# Patient Record
Sex: Male | Born: 1955
Health system: Southern US, Community
[De-identification: ages and names within clinical notes are randomized; demographics above are authoritative.]

## PROBLEM LIST (undated history)

## (undated) DIAGNOSIS — Z85828 Personal history of other malignant neoplasm of skin: Secondary | ICD-10-CM

## (undated) DIAGNOSIS — J309 Allergic rhinitis, unspecified: Secondary | ICD-10-CM

## (undated) DIAGNOSIS — E785 Hyperlipidemia, unspecified: Secondary | ICD-10-CM

## (undated) DIAGNOSIS — I639 Cerebral infarction, unspecified: Secondary | ICD-10-CM

## (undated) DIAGNOSIS — Z87442 Personal history of urinary calculi: Secondary | ICD-10-CM

## (undated) DIAGNOSIS — I6523 Occlusion and stenosis of bilateral carotid arteries: Secondary | ICD-10-CM

## (undated) DIAGNOSIS — I10 Essential (primary) hypertension: Secondary | ICD-10-CM

## (undated) HISTORY — PX: COLONOSCOPY: SHX174

## (undated) HISTORY — PX: OTHER SURGICAL HISTORY: SHX169

---

## 2006-04-19 ENCOUNTER — Ambulatory Visit: Payer: Self-pay | Admitting: Unknown Physician Specialty

## 2006-04-24 ENCOUNTER — Ambulatory Visit: Payer: Self-pay | Admitting: Family Medicine

## 2007-02-19 ENCOUNTER — Ambulatory Visit: Payer: Self-pay | Admitting: Family Medicine

## 2007-04-05 ENCOUNTER — Ambulatory Visit: Payer: Self-pay | Admitting: Unknown Physician Specialty

## 2017-01-04 DIAGNOSIS — L570 Actinic keratosis: Secondary | ICD-10-CM | POA: Diagnosis not present

## 2017-01-04 DIAGNOSIS — Z85828 Personal history of other malignant neoplasm of skin: Secondary | ICD-10-CM | POA: Diagnosis not present

## 2017-08-27 DIAGNOSIS — Z23 Encounter for immunization: Secondary | ICD-10-CM | POA: Diagnosis not present

## 2017-09-06 DIAGNOSIS — L570 Actinic keratosis: Secondary | ICD-10-CM | POA: Diagnosis not present

## 2017-09-06 DIAGNOSIS — Z85828 Personal history of other malignant neoplasm of skin: Secondary | ICD-10-CM | POA: Diagnosis not present

## 2017-10-25 DIAGNOSIS — G459 Transient cerebral ischemic attack, unspecified: Secondary | ICD-10-CM | POA: Diagnosis not present

## 2017-10-25 DIAGNOSIS — Z0000 Encounter for general adult medical examination without abnormal findings: Secondary | ICD-10-CM | POA: Diagnosis not present

## 2017-10-25 DIAGNOSIS — R972 Elevated prostate specific antigen [PSA]: Secondary | ICD-10-CM | POA: Diagnosis not present

## 2017-11-15 ENCOUNTER — Other Ambulatory Visit: Payer: Self-pay | Admitting: Family Medicine

## 2017-11-15 DIAGNOSIS — G459 Transient cerebral ischemic attack, unspecified: Secondary | ICD-10-CM

## 2017-11-24 ENCOUNTER — Ambulatory Visit
Admission: RE | Admit: 2017-11-24 | Discharge: 2017-11-24 | Disposition: A | Discharge status: Home / Self Care | Payer: 59 | Source: Ambulatory Visit | Attending: Family Medicine | Admitting: Family Medicine

## 2017-11-24 DIAGNOSIS — G459 Transient cerebral ischemic attack, unspecified: Secondary | ICD-10-CM | POA: Diagnosis not present

## 2017-11-29 ENCOUNTER — Other Ambulatory Visit: Payer: Self-pay | Admitting: Family Medicine

## 2017-11-29 DIAGNOSIS — I6381 Other cerebral infarction due to occlusion or stenosis of small artery: Secondary | ICD-10-CM

## 2017-11-29 DIAGNOSIS — R9389 Abnormal findings on diagnostic imaging of other specified body structures: Secondary | ICD-10-CM

## 2017-12-06 ENCOUNTER — Ambulatory Visit
Admission: RE | Admit: 2017-12-06 | Discharge: 2017-12-06 | Disposition: A | Discharge status: Home / Self Care | Payer: 59 | Source: Ambulatory Visit | Attending: Family Medicine | Admitting: Family Medicine

## 2017-12-06 DIAGNOSIS — R9389 Abnormal findings on diagnostic imaging of other specified body structures: Secondary | ICD-10-CM

## 2017-12-06 DIAGNOSIS — I639 Cerebral infarction, unspecified: Secondary | ICD-10-CM | POA: Diagnosis not present

## 2017-12-06 DIAGNOSIS — I6381 Other cerebral infarction due to occlusion or stenosis of small artery: Secondary | ICD-10-CM

## 2017-12-06 MED ORDER — GADOBENATE DIMEGLUMINE 529 MG/ML IV SOLN
20.0000 mL | Freq: Once | INTRAVENOUS | Status: AC | PRN
  Administered 2017-12-06: 20 mL via INTRAVENOUS

## 2017-12-21 DIAGNOSIS — R9089 Other abnormal findings on diagnostic imaging of central nervous system: Secondary | ICD-10-CM | POA: Diagnosis not present

## 2017-12-21 DIAGNOSIS — Z86010 Personal history of colonic polyps: Secondary | ICD-10-CM | POA: Diagnosis not present

## 2017-12-21 DIAGNOSIS — Z8673 Personal history of transient ischemic attack (TIA), and cerebral infarction without residual deficits: Secondary | ICD-10-CM | POA: Diagnosis not present

## 2018-01-25 DIAGNOSIS — Z8673 Personal history of transient ischemic attack (TIA), and cerebral infarction without residual deficits: Secondary | ICD-10-CM | POA: Diagnosis not present

## 2018-01-25 DIAGNOSIS — I6381 Other cerebral infarction due to occlusion or stenosis of small artery: Secondary | ICD-10-CM | POA: Diagnosis not present

## 2018-02-02 DIAGNOSIS — I639 Cerebral infarction, unspecified: Secondary | ICD-10-CM | POA: Diagnosis not present

## 2018-02-06 DIAGNOSIS — Z8673 Personal history of transient ischemic attack (TIA), and cerebral infarction without residual deficits: Secondary | ICD-10-CM | POA: Diagnosis not present

## 2018-02-06 DIAGNOSIS — I6523 Occlusion and stenosis of bilateral carotid arteries: Secondary | ICD-10-CM | POA: Diagnosis not present

## 2018-02-14 DIAGNOSIS — I639 Cerebral infarction, unspecified: Secondary | ICD-10-CM | POA: Diagnosis not present

## 2018-02-22 DIAGNOSIS — E782 Mixed hyperlipidemia: Secondary | ICD-10-CM | POA: Diagnosis not present

## 2018-02-22 DIAGNOSIS — D696 Thrombocytopenia, unspecified: Secondary | ICD-10-CM | POA: Diagnosis not present

## 2018-02-26 DIAGNOSIS — I10 Essential (primary) hypertension: Secondary | ICD-10-CM

## 2018-02-26 DIAGNOSIS — I6523 Occlusion and stenosis of bilateral carotid arteries: Secondary | ICD-10-CM | POA: Diagnosis not present

## 2018-02-26 DIAGNOSIS — E782 Mixed hyperlipidemia: Secondary | ICD-10-CM | POA: Diagnosis not present

## 2018-02-26 HISTORY — DX: Occlusion and stenosis of bilateral carotid arteries: I65.23

## 2018-02-26 HISTORY — DX: Essential (primary) hypertension: I10

## 2018-02-27 DIAGNOSIS — D696 Thrombocytopenia, unspecified: Secondary | ICD-10-CM | POA: Diagnosis not present

## 2018-02-27 DIAGNOSIS — E782 Mixed hyperlipidemia: Secondary | ICD-10-CM | POA: Diagnosis not present

## 2018-02-27 DIAGNOSIS — I10 Essential (primary) hypertension: Secondary | ICD-10-CM | POA: Diagnosis not present

## 2018-03-06 ENCOUNTER — Ambulatory Visit: Admit: 2018-03-06 | Payer: 59 | Admitting: Internal Medicine

## 2018-03-06 SURGERY — ECHOCARDIOGRAM, TRANSESOPHAGEAL
Anesthesia: Moderate Sedation

## 2018-04-30 DIAGNOSIS — Z8673 Personal history of transient ischemic attack (TIA), and cerebral infarction without residual deficits: Secondary | ICD-10-CM | POA: Diagnosis not present

## 2018-04-30 DIAGNOSIS — I6381 Other cerebral infarction due to occlusion or stenosis of small artery: Secondary | ICD-10-CM | POA: Diagnosis not present

## 2018-06-22 DIAGNOSIS — D696 Thrombocytopenia, unspecified: Secondary | ICD-10-CM | POA: Diagnosis not present

## 2018-06-22 DIAGNOSIS — I10 Essential (primary) hypertension: Secondary | ICD-10-CM | POA: Diagnosis not present

## 2018-06-22 DIAGNOSIS — E782 Mixed hyperlipidemia: Secondary | ICD-10-CM | POA: Diagnosis not present

## 2018-06-22 DIAGNOSIS — I6381 Other cerebral infarction due to occlusion or stenosis of small artery: Secondary | ICD-10-CM | POA: Diagnosis not present

## 2018-07-02 DIAGNOSIS — E782 Mixed hyperlipidemia: Secondary | ICD-10-CM | POA: Diagnosis not present

## 2018-07-02 DIAGNOSIS — I10 Essential (primary) hypertension: Secondary | ICD-10-CM | POA: Diagnosis not present

## 2018-07-02 DIAGNOSIS — D696 Thrombocytopenia, unspecified: Secondary | ICD-10-CM | POA: Diagnosis not present

## 2018-07-10 DIAGNOSIS — D2262 Melanocytic nevi of left upper limb, including shoulder: Secondary | ICD-10-CM | POA: Diagnosis not present

## 2018-07-10 DIAGNOSIS — X32XXXA Exposure to sunlight, initial encounter: Secondary | ICD-10-CM | POA: Diagnosis not present

## 2018-07-10 DIAGNOSIS — D2261 Melanocytic nevi of right upper limb, including shoulder: Secondary | ICD-10-CM | POA: Diagnosis not present

## 2018-07-10 DIAGNOSIS — Z85828 Personal history of other malignant neoplasm of skin: Secondary | ICD-10-CM | POA: Diagnosis not present

## 2018-07-10 DIAGNOSIS — L570 Actinic keratosis: Secondary | ICD-10-CM | POA: Diagnosis not present

## 2018-08-16 DIAGNOSIS — Z86010 Personal history of colonic polyps: Secondary | ICD-10-CM | POA: Diagnosis not present

## 2018-10-17 DIAGNOSIS — I358 Other nonrheumatic aortic valve disorders: Secondary | ICD-10-CM | POA: Diagnosis not present

## 2018-10-17 DIAGNOSIS — I10 Essential (primary) hypertension: Secondary | ICD-10-CM | POA: Diagnosis not present

## 2018-10-17 DIAGNOSIS — E782 Mixed hyperlipidemia: Secondary | ICD-10-CM | POA: Diagnosis not present

## 2018-10-22 DIAGNOSIS — I10 Essential (primary) hypertension: Secondary | ICD-10-CM | POA: Diagnosis not present

## 2018-10-22 DIAGNOSIS — D696 Thrombocytopenia, unspecified: Secondary | ICD-10-CM | POA: Diagnosis not present

## 2018-10-22 DIAGNOSIS — E782 Mixed hyperlipidemia: Secondary | ICD-10-CM | POA: Diagnosis not present

## 2018-10-23 ENCOUNTER — Encounter: Payer: Self-pay | Admitting: *Deleted

## 2018-10-24 ENCOUNTER — Ambulatory Visit
Admission: RE | Admit: 2018-10-24 | Discharge: 2018-10-24 | Disposition: A | Discharge status: Home / Self Care | Payer: 59 | Source: Ambulatory Visit | Attending: Unknown Physician Specialty | Admitting: Unknown Physician Specialty

## 2018-10-24 ENCOUNTER — Encounter
Admission: RE | Disposition: A | Discharge status: Home / Self Care | Payer: Self-pay | Source: Ambulatory Visit | Attending: Unknown Physician Specialty

## 2018-10-24 ENCOUNTER — Ambulatory Visit: Payer: 59 | Admitting: Anesthesiology

## 2018-10-24 ENCOUNTER — Other Ambulatory Visit: Payer: Self-pay

## 2018-10-24 DIAGNOSIS — E785 Hyperlipidemia, unspecified: Secondary | ICD-10-CM | POA: Diagnosis not present

## 2018-10-24 DIAGNOSIS — K640 First degree hemorrhoids: Secondary | ICD-10-CM | POA: Diagnosis not present

## 2018-10-24 DIAGNOSIS — Z7982 Long term (current) use of aspirin: Secondary | ICD-10-CM | POA: Insufficient documentation

## 2018-10-24 DIAGNOSIS — J309 Allergic rhinitis, unspecified: Secondary | ICD-10-CM | POA: Diagnosis not present

## 2018-10-24 DIAGNOSIS — K635 Polyp of colon: Secondary | ICD-10-CM | POA: Insufficient documentation

## 2018-10-24 DIAGNOSIS — I10 Essential (primary) hypertension: Secondary | ICD-10-CM | POA: Diagnosis not present

## 2018-10-24 DIAGNOSIS — Z79899 Other long term (current) drug therapy: Secondary | ICD-10-CM | POA: Diagnosis not present

## 2018-10-24 DIAGNOSIS — Z1211 Encounter for screening for malignant neoplasm of colon: Secondary | ICD-10-CM | POA: Diagnosis not present

## 2018-10-24 DIAGNOSIS — Z86010 Personal history of colonic polyps: Secondary | ICD-10-CM | POA: Diagnosis not present

## 2018-10-24 DIAGNOSIS — Z85828 Personal history of other malignant neoplasm of skin: Secondary | ICD-10-CM | POA: Diagnosis not present

## 2018-10-24 HISTORY — DX: Occlusion and stenosis of bilateral carotid arteries: I65.23

## 2018-10-24 HISTORY — DX: Personal history of other malignant neoplasm of skin: Z85.828

## 2018-10-24 HISTORY — DX: Hyperlipidemia, unspecified: E78.5

## 2018-10-24 HISTORY — DX: Personal history of urinary calculi: Z87.442

## 2018-10-24 HISTORY — DX: Essential (primary) hypertension: I10

## 2018-10-24 HISTORY — DX: Cerebral infarction, unspecified: I63.9

## 2018-10-24 HISTORY — DX: Allergic rhinitis, unspecified: J30.9

## 2018-10-24 HISTORY — PX: COLONOSCOPY WITH PROPOFOL: SHX5780

## 2018-10-24 SURGERY — COLONOSCOPY WITH PROPOFOL
Anesthesia: General

## 2018-10-24 MED ORDER — PROPOFOL 10 MG/ML IV BOLUS
INTRAVENOUS | Status: DC | PRN
  Administered 2018-10-24: 50 mg via INTRAVENOUS

## 2018-10-24 MED ORDER — SODIUM CHLORIDE 0.9 % IV SOLN
INTRAVENOUS | Status: DC
  Administered 2018-10-24: 12:00:00 via INTRAVENOUS

## 2018-10-24 MED ORDER — PROPOFOL 500 MG/50ML IV EMUL
INTRAVENOUS | Status: DC | PRN
  Administered 2018-10-24: 120 ug/kg/min via INTRAVENOUS

## 2018-10-24 MED ORDER — SODIUM CHLORIDE 0.9 % IV SOLN: INTRAVENOUS | Status: DC

## 2018-10-24 NOTE — Anesthesia Postprocedure Evaluation (Signed)
Anesthesia Post Note  Patient: Johnathan Schneider  Procedure(s) Performed: COLONOSCOPY WITH PROPOFOL (N/A )  Patient location during evaluation: Endoscopy Anesthesia Type: General Level of consciousness: awake and alert Pain management: pain level controlled Vital Signs Assessment: post-procedure vital signs reviewed and stable Respiratory status: spontaneous breathing, nonlabored ventilation, respiratory function stable and patient connected to nasal cannula oxygen Cardiovascular status: blood pressure returned to baseline and stable Postop Assessment: no apparent nausea or vomiting Anesthetic complications: no     Last Vitals:  Vitals:   10/24/18 1113 10/24/18 1220  BP: 123/70 (!) 83/52  Pulse: 61 60  Resp: 18 (!) 26  Temp: 36.7 C (!) 36.2 C  SpO2: 98% 95%    Last Pain:  Vitals:   10/24/18 1220  TempSrc:   PainSc: 0-No pain                 Alicyn Klann S

## 2018-10-24 NOTE — H&P (Signed)
Primary Care Physician:  Dion Body, MD Primary Gastroenterologist:  Dr. Vira Agar  Pre-Procedure History & Physical: HPI:  Johnathan Schneider is a 62 y.o. male is here for an colonoscopy.  Last colonoscopy was 12 years ago.   Past Medical History:  Diagnosis Date  . Allergic rhinitis   . Atherosclerosis of both carotid arteries 02/26/2018  . History of basal cell carcinoma   . History of kidney stones   . Hyperlipidemia   . Hypertension 02/26/2018  . Stroke [facility])     Past Surgical History:  Procedure Laterality Date  . BCC excision    . COLONOSCOPY  05/33/2007    Prior to Admission medications   Medication Sig Start Date End Date Taking? Authorizing Provider  aspirin 81 MG tablet Take 81 mg by mouth daily.   Yes [provider]  atorvastatin (LIPITOR) 80 MG tablet Take 80 mg by mouth daily.   Yes [provider]  BIOTIN PO Take by mouth daily.   Yes [provider]  fexofenadine (ALLEGRA) 60 MG tablet Take 60 mg by mouth 2 (two) times daily.   Yes [provider]  lisinopril (PRINIVIL,ZESTRIL) 5 MG tablet Take 5 mg by mouth daily.   Yes [provider]  Multiple Vitamin (MULTIVITAMIN) tablet Take 1 tablet by mouth daily.   Yes [provider]  niacin 500 MG tablet Take 500 mg by mouth at bedtime.   Yes [provider]  Omega-3 Fatty Acids (FISH OIL PO) Take by mouth daily.   Yes [provider]    Allergies as of 09/06/2018  . (Not on File)    Family History  Problem Relation Age of Onset  . Hyperlipidemia Mother   . Adrenal disorder Mother   . Stroke Mother   . AAA (abdominal aortic aneurysm) Father   . Coronary artery disease Father     Social History   Socioeconomic History  . Marital status: Married    Spouse name: Not on file  . Number of children: Not on file  . Years of education: Not on file  . Highest education level: Not on file  Occupational History  . Not on file  Social  Needs  . Financial resource strain: Not on file  . Food insecurity:    Worry: Not on file    Inability: Not on file  . Transportation needs:    Medical: Not on file    Non-medical: Not on file  Tobacco Use  . Smoking status: Never Smoker  . Smokeless tobacco: Never Used  Substance and Sexual Activity  . Alcohol use: Yes    Alcohol/week: 3.0 standard drinks    Types: 3 Glasses of wine per week  . Drug use: Never  . Sexual activity: Yes  Lifestyle  . Physical activity:    Days per week: Not on file    Minutes per session: Not on file  . Stress: Not on file  Relationships  . Social connections:    Talks on phone: Not on file    Gets together: Not on file    Attends religious service: Not on file    Active member of club or organization: Not on file    Attends meetings of clubs or organizations: Not on file    Relationship status: Not on file  . Intimate partner violence:    Fear of current or ex partner: Not on file    Emotionally abused: Not on file    Physically abused: Not  on file    Forced sexual activity: Not on file  Other Topics Concern  . Not on file  Social History Narrative  . Not on file    Review of Systems: See HPI, otherwise negative ROS  Physical Exam: BP 123/70   Pulse 61   Temp 98 F (36.7 C) (Oral)   Resp 18   Ht 5\' 9"  (1.753 m)   Wt 81.2 kg   SpO2 98%   BMI 26.43 kg/m  General:   Alert,  pleasant and cooperative in NAD Head:  Normocephalic and atraumatic. Neck:  Supple; no masses or thyromegaly. Lungs:  Clear throughout to auscultation.    Heart:  Regular rate and rhythm. Abdomen:  Soft, nontender and nondistended. Normal bowel sounds, without guarding, and without rebound.   Neurologic:  Alert and  oriented x4;  grossly normal neurologically.  Impression/Plan: Johnathan Schneider is here for an colonoscopy to be performed for [facility] colon polyps.  Risks, benefits, limitations, and alternatives regarding  colonoscopy have been reviewed with the  patient.  Questions have been answered.  All parties agreeable.   Gaylyn Cheers, MD  10/24/2018, 11:44 AM

## 2018-10-24 NOTE — Anesthesia Preprocedure Evaluation (Signed)
Anesthesia Evaluation  Patient identified by MRN, date of birth, ID band Patient awake    Reviewed: Allergy & Precautions, NPO status , Patient's Chart, lab work & pertinent test results, reviewed documented beta blocker date and time   Airway Mallampati: II  TM Distance: >3 FB     Dental  (+) Chipped   Pulmonary           Cardiovascular hypertension, Pt. on medications      Neuro/Psych    GI/Hepatic   Endo/Other    Renal/GU      Musculoskeletal   Abdominal   Peds  Hematology   Anesthesia Other Findings Denies stroke. Posible TIA.  Reproductive/Obstetrics                             Anesthesia Physical Anesthesia Plan  ASA: II  Anesthesia Plan: General   Post-op Pain Management:    Induction: Intravenous  PONV Risk Score and Plan:   Airway Management Planned:   Additional Equipment:   Intra-op Plan:   Post-operative Plan:   Informed Consent: I have reviewed the patients History and Physical, chart, labs and discussed the procedure including the risks, benefits and alternatives for the proposed anesthesia with the patient or authorized representative who has indicated his/her understanding and acceptance.     Plan Discussed with: CRNA  Anesthesia Plan Comments:         Anesthesia Quick Evaluation

## 2018-10-24 NOTE — Transfer of Care (Signed)
Immediate Anesthesia Transfer of Care Note  Patient: Johnathan Schneider  Procedure(s) Performed: COLONOSCOPY WITH PROPOFOL (N/A )  Patient Location: PACU and Endoscopy Unit  Anesthesia Type:General  Level of Consciousness: drowsy  Airway & Oxygen Therapy: Patient Spontanous Breathing  Post-op Assessment: Report given to RN  Post vital signs: Reviewed and stable  Last Vitals:  Vitals Value Taken Time  BP 90/52 10/24/2018 12:20 PM  Temp 36.2 C 10/24/2018 12:20 PM  Pulse 59 10/24/2018 12:21 PM  Resp 15 10/24/2018 12:21 PM  SpO2 96 % 10/24/2018 12:21 PM  Vitals shown include unvalidated device data.  Last Pain:  Vitals:   10/24/18 1220  TempSrc:   PainSc: (P) 0-No pain         Complications: No apparent anesthesia complications

## 2018-10-24 NOTE — Anesthesia Post-op Follow-up Note (Signed)
Anesthesia QCDR form completed.        

## 2018-10-25 ENCOUNTER — Encounter: Payer: Self-pay | Admitting: Unknown Physician Specialty

## 2018-10-25 LAB — SURGICAL PATHOLOGY

## 2018-10-25 NOTE — Op Note (Signed)
[facility] Gastroenterology Patient Name: Johnathan Schneider Procedure Date: 10/24/2018 11:38 AM MRN: [id number] Account #: [id number] Date of Birth: 1956/05/20 Admit Type: Outpatient Age: 62 Room: [facility] ENDO ROOM 2 Gender: Male Note Status: Finalized Procedure:            Colonoscopy Indications:          Screening for colorectal malignant neoplasm Providers:            Manya Silvas, MD Referring MD:         Dion Body (Referring MD) Medicines:            Propofol per Anesthesia Complications:        No immediate complications. Procedure:            Pre-Anesthesia Assessment:                       - After reviewing the risks and benefits, the patient                        was deemed in satisfactory condition to undergo the                        procedure.                       After obtaining informed consent, the colonoscope was                        passed under direct vision. Throughout the procedure,                        the patient's blood pressure, pulse, and oxygen                        saturations were monitored continuously. The                        Colonoscope was introduced through the anus and                        advanced to the the cecum, identified by appendiceal                        orifice and ileocecal valve. The colonoscopy was                        performed without difficulty. The patient tolerated the                        procedure well. The quality of the bowel preparation                        was excellent. Findings:      A diminutive polyp was found in the ascending colon. The polyp was       sessile. The polyp was removed with a cold snare. Resection and       retrieval were complete.      Internal hemorrhoids were found during endoscopy. The hemorrhoids were       small and Grade I (internal hemorrhoids that do not prolapse).      The exam was otherwise without  abnormality. Impression:           - One diminutive  polyp in the ascending colon, removed                        with a cold snare. Resected and retrieved.                       - Internal hemorrhoids.                       - The examination was otherwise normal. Recommendation:       - Await pathology results. Manya Silvas, MD 10/24/2018 12:25:28 PM This report has been signed electronically. Number of Addenda: 0 Note Initiated On: 10/24/2018 11:38 AM Scope Withdrawal Time: 0 hours 8 minutes 9 seconds  Total Procedure Duration: 0 hours 23 minutes 50 seconds       [facility]

## 2018-10-29 DIAGNOSIS — Z0000 Encounter for general adult medical examination without abnormal findings: Secondary | ICD-10-CM | POA: Diagnosis not present

## 2018-10-29 DIAGNOSIS — E782 Mixed hyperlipidemia: Secondary | ICD-10-CM | POA: Diagnosis not present

## 2018-10-29 DIAGNOSIS — I10 Essential (primary) hypertension: Secondary | ICD-10-CM | POA: Diagnosis not present

## 2019-01-23 IMAGING — MR MR HEAD WO/W CM
13 series · 48 of 48 positions shown · IV contrast (multihance)
Comparison: CT head 11/24/2017

CLINICAL DATA: Lacunar infarction.  Abnormal CT.

EXAM:
MRI HEAD WITHOUT AND WITH CONTRAST
TECHNIQUE: Multiplanar, multiecho pulse sequences of the brain and surrounding
structures were obtained without and with intravenous contrast.
CONTRAST:  20mL MULTIHANCE GADOBENATE DIMEGLUMINE 529 MG/ML IV SOLN

[Series 2: T1 · sagittal · 5.0mm · 0.47mm/px · 3 of 28 slices shown]
[im 1/28]
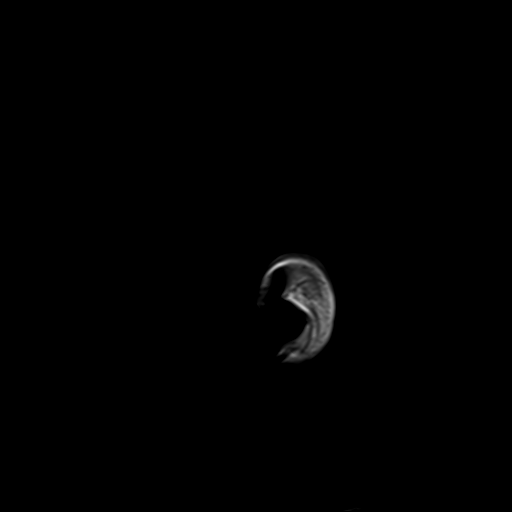
[im 14/28]
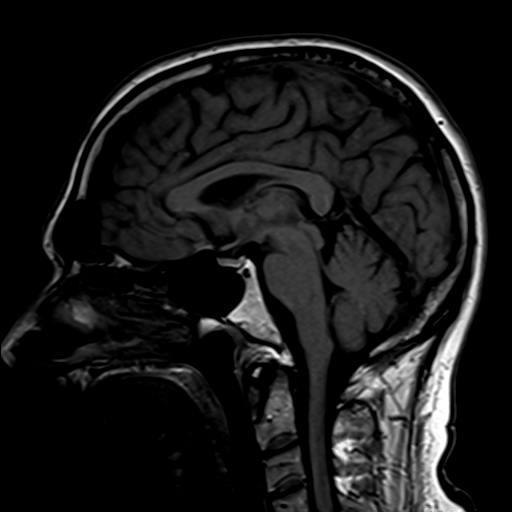
[im 28/28]
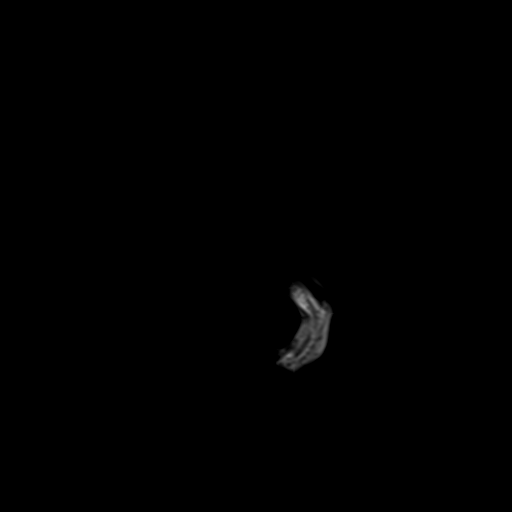

[Series 3: DWI · axial · 3.0mm · 1.88mm/px · z∈[-76,+82]mm · 7 of 108 slices shown (1 of 4)]
[im 1/108]
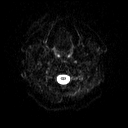
[im 18/108]
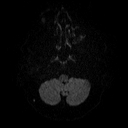
[im 36/108]
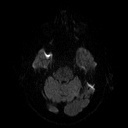
[im 54/108]
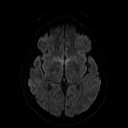
[im 72/108]
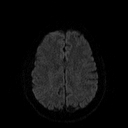
[im 90/108]
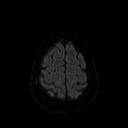
[im 108/108]
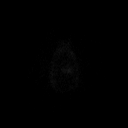

[Series 4: DWI · axial · 3.0mm · 1.88mm/px · z∈[-76,+82]mm · 3 of 53 slices shown (2 of 4)]
[im 1/53]
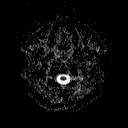
[im 27/53]
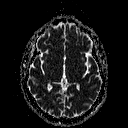
[im 53/53]
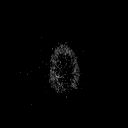

[Series 5: FLAIR · axial · 3.0mm · 0.47mm/px · z∈[-75,+81]mm · 2 of 35 slices shown]
[im 1/35]
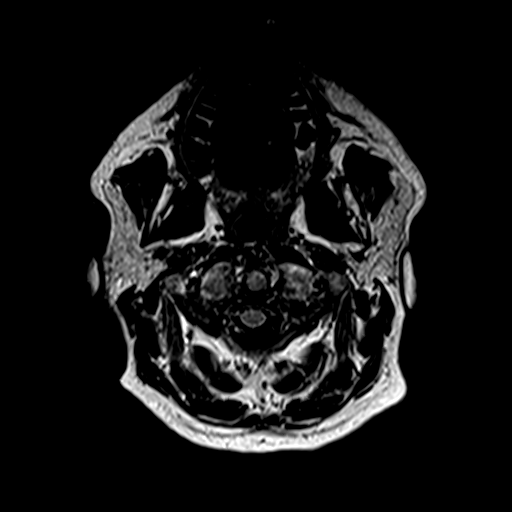
[im 35/35]
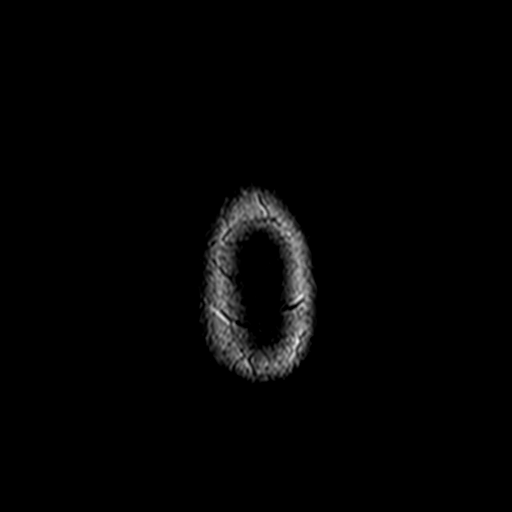

[Series 6: T2 · axial · 5.0mm · 0.62mm/px · 1 of 25 slices shown (1 of 2)]
[im 1/25]
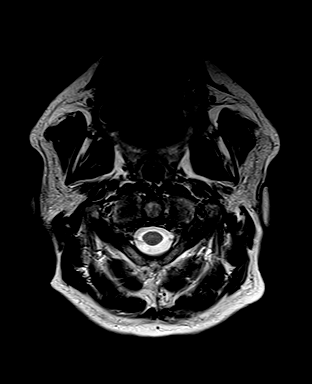

[Series 7: mip_images(sw) · axial · 40.0mm · 0.94mm/px · 1 of 25 slices shown]
[im 1/25]
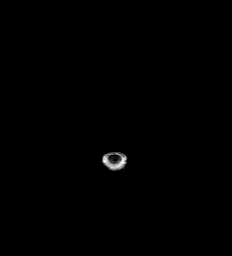

[Series 8: swi_images · axial · 5.0mm · 0.94mm/px · z∈[-74,+80]mm · 2 of 32 slices shown]
[im 1/32]
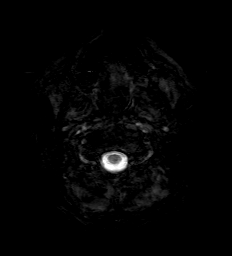
[im 32/32]
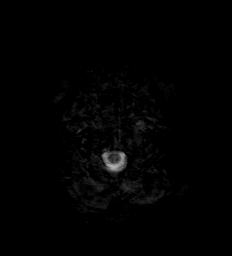

[Series 9: t1_mpr_tra · axial · 1.0mm · 0.75mm/px · z∈[-76,+81]mm · 9 of 160 slices shown (1 of 2)]
[im 1/160]
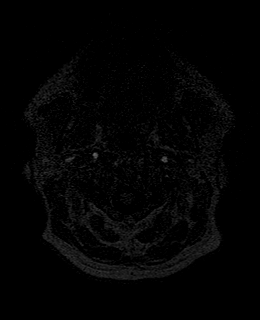
[im 20/160]
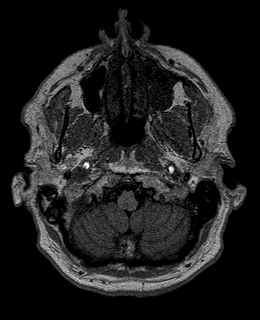
[im 40/160]
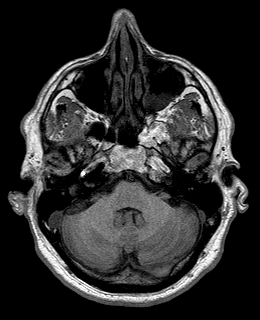
[im 60/160]
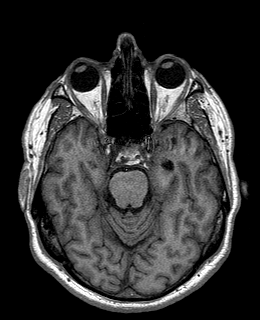
[im 80/160]
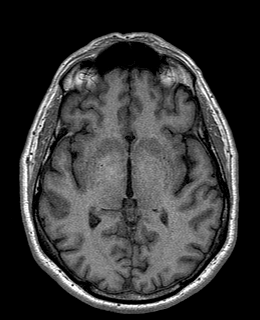
[im 100/160]
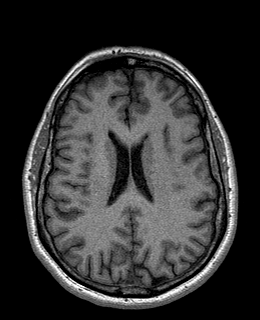
[im 120/160]
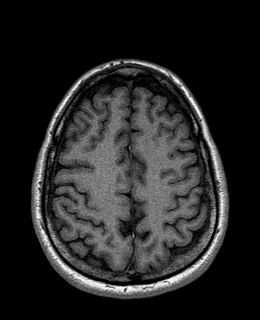
[im 140/160]
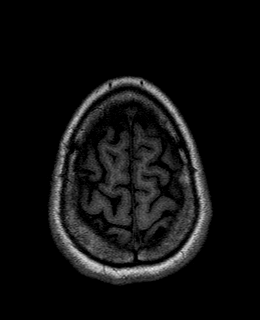
[im 160/160]
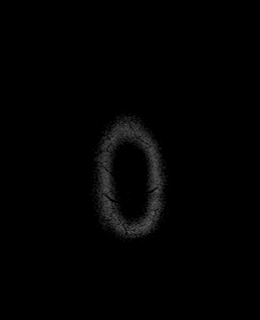

[Series 10: DWI · coronal · 5.0mm · 1.80mm/px · 5 of 82 slices shown (3 of 4)]
[im 1/82]
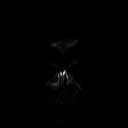
[im 21/82]
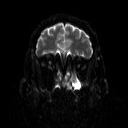
[im 41/82]
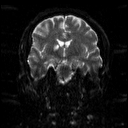
[im 61/82]
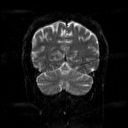
[im 82/82]
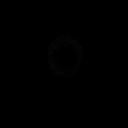

[Series 11: DWI · coronal · 5.0mm · 1.80mm/px · 2 of 41 slices shown (4 of 4)]
[im 1/41]
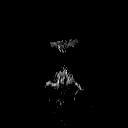
[im 41/41]
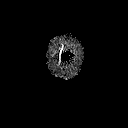

[Series 12: T2 · coronal · 5.0mm · 0.45mm/px · 2 of 32 slices shown (2 of 2)]
[im 1/32]
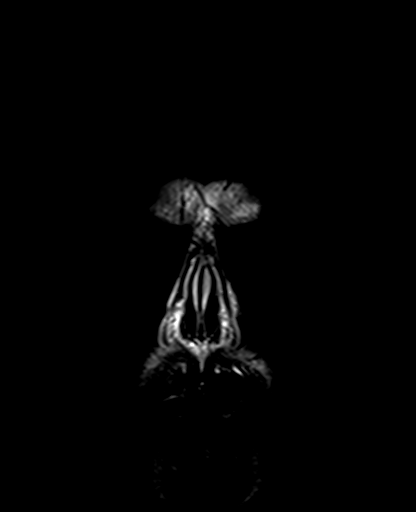
[im 32/32]
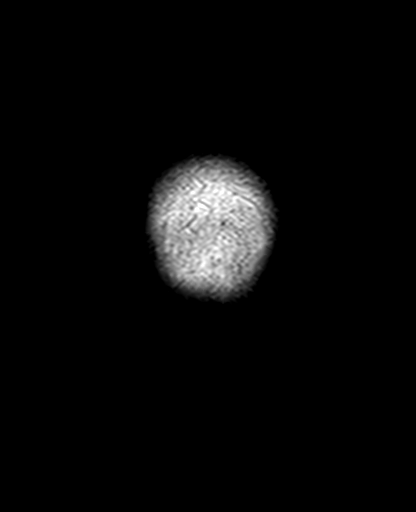

[Series 13: t1_mpr_tra · axial · 1.0mm · 0.75mm/px · z∈[-76,+81]mm · 9 of 160 slices shown (2 of 2)]
[im 1/160]
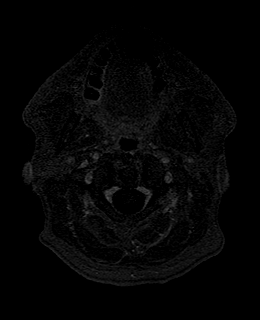
[im 20/160]
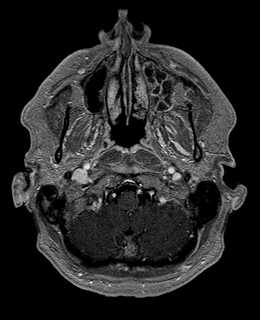
[im 40/160]
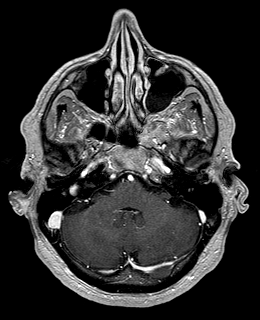
[im 60/160]
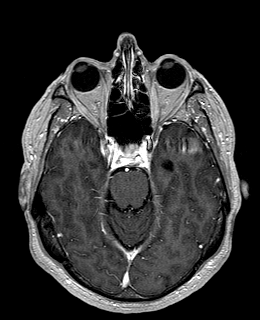
[im 80/160]
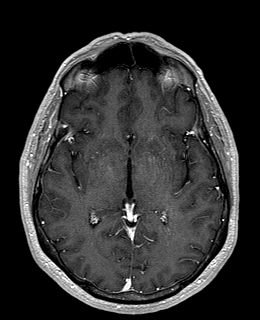
[im 100/160]
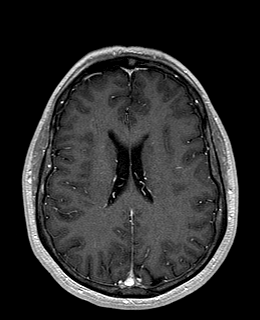
[im 120/160]
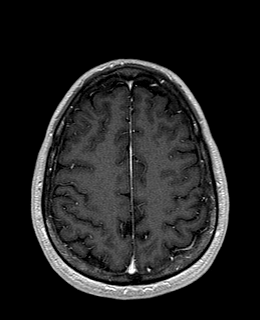
[im 140/160]
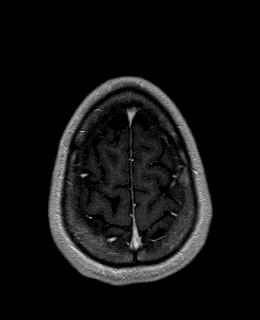
[im 160/160]
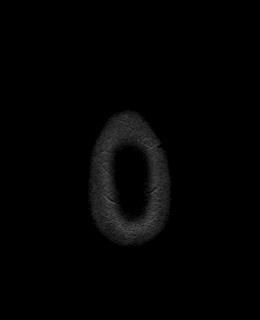

[Series 14: post cor · coronal · 5.0mm · 0.45mm/px · 2 of 32 slices shown]
[im 1/32]
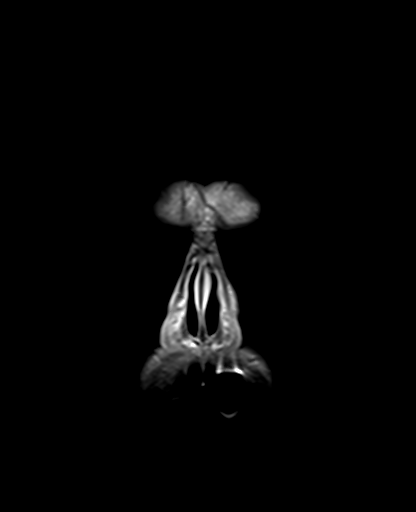
[im 32/32]
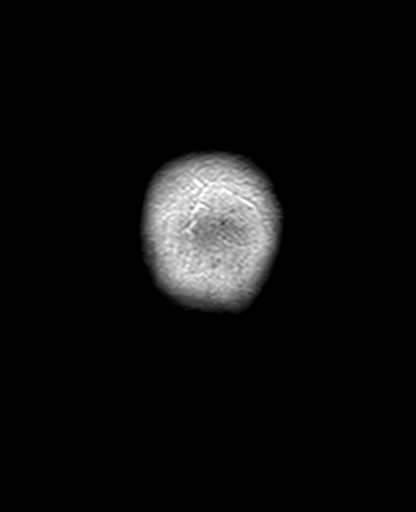

[48 of 48 positions shown; findings below may reference images not displayed]

FINDINGS: Brain: Lacunar infarction in the posterior limb internal capsule on
the right. The medial component has well-defined CSF fluid signal
characteristics compatible with chronic lacunar infarction. Just
lateral to this is an area of increased signal on diffusion-weighted
imaging and normalized ADC map. The lesion does not show abnormal
enhancement or surrounding edema.

No other significant infarction identified. Negative for hemorrhage
or mass. Normal enhancement postcontrast infusion.

Vascular: Normal arterial flow voids.  Normal venous enhancement.

Skull and upper cervical spine: Negative

Sinuses/Orbits: Mucosal edema and air-fluid level left maxillary
sinus. Normal orbit.

Other: None
IMPRESSION: Lacunar infarction posterior limb internal capsule on the right. The
majority of this appears chronic. There is tissue just lateral to
the lacunar infarction which could represent recent extension of
infarction and subacute infarct. Correlate with any change in
neurologic exam over the last several weeks. Otherwise negative.

## 2024-03-21 ENCOUNTER — Encounter: Payer: Self-pay | Admitting: Gastroenterology

## 2024-03-29 ENCOUNTER — Encounter
Admission: RE | Disposition: A | Discharge status: Home / Self Care | Payer: Self-pay | Source: Home / Self Care | Attending: Gastroenterology

## 2024-03-29 ENCOUNTER — Other Ambulatory Visit: Payer: Self-pay

## 2024-03-29 ENCOUNTER — Ambulatory Visit: Admitting: Anesthesiology

## 2024-03-29 ENCOUNTER — Ambulatory Visit
Admission: RE | Admit: 2024-03-29 | Discharge: 2024-03-29 | Disposition: A | Discharge status: Home / Self Care | Attending: Gastroenterology | Admitting: Gastroenterology

## 2024-03-29 ENCOUNTER — Encounter: Payer: Self-pay | Admitting: Gastroenterology

## 2024-03-29 DIAGNOSIS — Z1211 Encounter for screening for malignant neoplasm of colon: Secondary | ICD-10-CM | POA: Insufficient documentation

## 2024-03-29 DIAGNOSIS — D128 Benign neoplasm of rectum: Secondary | ICD-10-CM | POA: Diagnosis not present

## 2024-03-29 DIAGNOSIS — K648 Other hemorrhoids: Secondary | ICD-10-CM | POA: Insufficient documentation

## 2024-03-29 DIAGNOSIS — K644 Residual hemorrhoidal skin tags: Secondary | ICD-10-CM | POA: Insufficient documentation

## 2024-03-29 DIAGNOSIS — D120 Benign neoplasm of cecum: Secondary | ICD-10-CM | POA: Insufficient documentation

## 2024-03-29 DIAGNOSIS — Z79899 Other long term (current) drug therapy: Secondary | ICD-10-CM | POA: Diagnosis not present

## 2024-03-29 DIAGNOSIS — Z8673 Personal history of transient ischemic attack (TIA), and cerebral infarction without residual deficits: Secondary | ICD-10-CM | POA: Diagnosis not present

## 2024-03-29 DIAGNOSIS — I10 Essential (primary) hypertension: Secondary | ICD-10-CM | POA: Diagnosis not present

## 2024-03-29 DIAGNOSIS — Z7982 Long term (current) use of aspirin: Secondary | ICD-10-CM | POA: Insufficient documentation

## 2024-03-29 HISTORY — PX: COLONOSCOPY: SHX5424

## 2024-03-29 SURGERY — COLONOSCOPY
Anesthesia: General

## 2024-03-29 MED ORDER — PROPOFOL 500 MG/50ML IV EMUL
INTRAVENOUS | Status: DC | PRN
  Administered 2024-03-29: 75 ug/kg/min via INTRAVENOUS

## 2024-03-29 MED ORDER — DEXMEDETOMIDINE HCL IN NACL 80 MCG/20ML IV SOLN
INTRAVENOUS | Status: DC | PRN
  Administered 2024-03-29: 20 ug via INTRAVENOUS

## 2024-03-29 MED ORDER — PHENYLEPHRINE 80 MCG/ML (10ML) SYRINGE FOR IV PUSH (FOR BLOOD PRESSURE SUPPORT)
PREFILLED_SYRINGE | INTRAVENOUS | Status: AC
  Filled 2024-03-29: qty 10

## 2024-03-29 MED ORDER — SODIUM CHLORIDE 0.9 % IV SOLN: INTRAVENOUS | Status: DC

## 2024-03-29 MED ORDER — PROPOFOL 10 MG/ML IV BOLUS
INTRAVENOUS | Status: DC | PRN
  Administered 2024-03-29: 50 mg via INTRAVENOUS
  Administered 2024-03-29: 30 mg via INTRAVENOUS

## 2024-03-29 MED ORDER — LIDOCAINE HCL (PF) 2 % IJ SOLN
INTRAMUSCULAR | Status: AC
  Filled 2024-03-29: qty 5

## 2024-03-29 MED ORDER — LIDOCAINE HCL (CARDIAC) PF 100 MG/5ML IV SOSY
PREFILLED_SYRINGE | INTRAVENOUS | Status: DC | PRN
  Administered 2024-03-29: 80 mg via INTRAVENOUS

## 2024-03-29 NOTE — Anesthesia Postprocedure Evaluation (Signed)
 Anesthesia Post Note  Patient: Johnathan Schneider  Procedure(s) Performed: COLONOSCOPY  Patient location during evaluation: PACU Anesthesia Type: General Level of consciousness: awake and alert Pain management: pain level controlled Vital Signs Assessment: post-procedure vital signs reviewed and stable Respiratory status: spontaneous breathing, nonlabored ventilation, respiratory function stable and patient connected to nasal cannula oxygen Cardiovascular status: blood pressure returned to baseline and stable Postop Assessment: no apparent nausea or vomiting Anesthetic complications: no   No notable events documented.   Last Vitals:  Vitals:   03/29/24 1314 03/29/24 1324  BP: 123/68 132/75  Pulse:    Resp:    Temp:    SpO2:      Last Pain:  Vitals:   03/29/24 1324  TempSrc:   PainSc: 0-No pain                 Zula Hitch

## 2024-03-29 NOTE — Op Note (Signed)
 [facility] Gastroenterology Patient Name: Johnathan Schneider Procedure Date: 03/29/2024 12:15 PM MRN: [id number] Account #: [id number] Date of Birth: [birth year] Admit Type: Outpatient Age: 68 Room: [facility] ENDO ROOM 1 Gender: Male Note Status: Finalized Instrument Name: Colonscope [id number] Procedure:             Colonoscopy Indications:           High risk colon cancer surveillance: Personal history                         of colonic polyps Providers:             Quintin Buckle DO, DO Medicines:             Monitored Anesthesia Care Complications:         No immediate complications. Estimated blood loss:                         Minimal. Procedure:             Pre-Anesthesia Assessment:                        - Prior to the procedure, a History and Physical was                         performed, and patient medications and allergies were                         reviewed. The patient is competent. The risks and                         benefits of the procedure and the sedation options and                         risks were discussed with the patient. All questions                         were answered and informed consent was obtained.                         Patient identification and proposed procedure were                         verified by the physician, the nurse, the anesthetist                         and the technician in the endoscopy suite. Mental                         Status Examination: alert and oriented. Airway                         Examination: normal oropharyngeal airway and neck                         mobility. Respiratory Examination: clear to                         auscultation. CV Examination: RRR, no murmurs, no S3  or S4. Prophylactic Antibiotics: The patient does not                         require prophylactic antibiotics. Prior                         Anticoagulants: The patient has taken no anticoagulant                          or antiplatelet agents. ASA Grade Assessment: II - A                         patient with mild systemic disease. After reviewing                         the risks and benefits, the patient was deemed in                         satisfactory condition to undergo the procedure. The                         anesthesia plan was to use monitored anesthesia care                         (MAC). Immediately prior to administration of                         medications, the patient was re-assessed for adequacy                         to receive sedatives. The heart rate, respiratory                         rate, oxygen saturations, blood pressure, adequacy of                         pulmonary ventilation, and response to care were                         monitored throughout the procedure. The physical                         status of the patient was re-assessed after the                         procedure.                        After obtaining informed consent, the colonoscope was                         passed under direct vision. Throughout the procedure,                         the patient's blood pressure, pulse, and oxygen                         saturations were monitored continuously. The  Colonoscope was introduced through the anus and                         advanced to the the cecum, identified by appendiceal                         orifice and ileocecal valve. The colonoscopy was                         somewhat difficult due to a redundant colon and                         significant looping. Successful completion of the                         procedure was aided by straightening and shortening                         the scope to obtain bowel loop reduction, using scope                         torsion, applying abdominal pressure and lavage. The                         patient tolerated the procedure well. The quality of                         the  bowel preparation was evaluated using the BBPS                         [facility] Bowel Preparation Scale) with scores of: Right                         Colon = 2 (minor amount of residual staining, small                         fragments of stool and/or opaque liquid, but mucosa                         seen well), Transverse Colon = 2 (minor amount of                         residual staining, small fragments of stool and/or                         opaque liquid, but mucosa seen well) and Left Colon =                         2 (minor amount of residual staining, small fragments                         of stool and/or opaque liquid, but mucosa seen well).                         The total BBPS score equals 6. The quality of the  bowel preparation was good. The ileocecal valve,                         appendiceal orifice, and rectum were photographed. Findings:      Hemorrhoids were found on perianal exam.      The digital rectal exam was normal. Pertinent negatives include normal       sphincter tone.      Two sessile polyps were found in the rectum and cecum. The polyps were 1       to 2 mm in size. These polyps were removed with a jumbo cold forceps.       Resection and retrieval were complete. Estimated blood loss was minimal.      Non-bleeding external and internal hemorrhoids were found during       retroflexion and during perianal exam. Estimated blood loss: none.      The exam was otherwise without abnormality on direct and retroflexion       views. Impression:            - Hemorrhoids found on perianal exam.                        - Two 1 to 2 mm polyps in the rectum and in the cecum,                         removed with a jumbo cold forceps. Resected and                         retrieved.                        - Non-bleeding external and internal hemorrhoids.                        - The examination was otherwise normal on direct and                          retroflexion views. Recommendation:        - Patient has a contact number available for                         emergencies. The signs and symptoms of potential                         delayed complications were discussed with the patient.                         Return to normal activities tomorrow. Written                         discharge instructions were provided to the patient.                        - Discharge patient to home.                        - Resume previous diet.                        - Continue present medications.                        -  Await pathology results.                        - Repeat colonoscopy for surveillance based on                         pathology results.                        - Return to referring physician as previously                         scheduled.                        - The findings and recommendations were discussed with                         the patient. Procedure Code(s):     --- Professional ---                        [phone], Colonoscopy, flexible; with biopsy, single or                         multiple Diagnosis Code(s):     --- Professional ---                        Z86.010, Personal history of colonic polyps                        D12.8, Benign neoplasm of rectum                        D12.0, Benign neoplasm of cecum                        K64.8, Other hemorrhoids CPT copyright 2022 [employer]. All rights reserved. The codes documented in this report are preliminary and upon coder review may  be revised to meet current compliance requirements. Attending Participation:      I personally performed the entire procedure. Polo Brisk, DO Quintin Buckle DO, DO 03/29/2024 1:00:24 PM This report has been signed electronically. Number of Addenda: 0 Note Initiated On: 03/29/2024 12:15 PM Scope Withdrawal Time: 0 hours 11 minutes 41 seconds  Total Procedure Duration: 0 hours 35 minutes 17 seconds  Estimated  Blood Loss:  Estimated blood loss was minimal.      [facility]

## 2024-03-29 NOTE — H&P (Signed)
 Pre-Procedure H&P   Patient ID: Johnathan Schneider is a 68 y.o. male.  Gastroenterology Provider: Quintin Buckle, DO  Referring Provider: Dr. Jerone Moorman PCP: Monique Ano, MD  Date: 03/29/2024  HPI Johnathan Schneider is a 68 y.o. male who presents today for Colonoscopy for Personal history of colon polyps .  Patient last underwent colonoscopy in 2019 with only findings being internal hemorrhoids.  He did have an adenomatous polyp in 2007.  Daily bowel movement without melena or hematochezia  Hemoglobin 14.4 MCV 88 platelets 124,000 creatinine 0.7   Past Medical History:  Diagnosis Date   Allergic rhinitis    Atherosclerosis of both carotid arteries 02/26/2018   History of basal cell carcinoma    History of kidney stones    Hyperlipidemia    Hypertension 02/26/2018   Stroke [facility])     Past Surgical History:  Procedure Laterality Date   BCC excision     COLONOSCOPY  05/33/2007   COLONOSCOPY WITH PROPOFOL  N/A 10/24/2018   Procedure: COLONOSCOPY WITH PROPOFOL ;  Surgeon: Cassie Click, MD;  Location: [facility] ENDOSCOPY;  Service: Endoscopy;  Laterality: N/A;    Family History No h/o GI disease or malignancy  Review of Systems  Constitutional:  Negative for activity change, appetite change, chills, diaphoresis, fatigue, fever and unexpected weight change.  HENT:  Negative for trouble swallowing and voice change.   Respiratory:  Negative for shortness of breath and wheezing.   Cardiovascular:  Negative for chest pain, palpitations and leg swelling.  Gastrointestinal:  Negative for abdominal distention, abdominal pain, anal bleeding, blood in stool, constipation, diarrhea, nausea and vomiting.  Musculoskeletal:  Negative for arthralgias and myalgias.  Skin:  Negative for color change and pallor.  Neurological:  Negative for dizziness, syncope and weakness.  Psychiatric/Behavioral:  Negative for confusion. The patient is not nervous/anxious.   All other systems  reviewed and are negative.    Medications No current facility-administered medications on file prior to encounter.   Current Outpatient Medications on File Prior to Encounter  Medication Sig Dispense Refill   aspirin 81 MG tablet Take 81 mg by mouth daily.     atorvastatin (LIPITOR) 80 MG tablet Take 80 mg by mouth daily.     BIOTIN PO Take by mouth daily.     fexofenadine (ALLEGRA) 60 MG tablet Take 60 mg by mouth 2 (two) times daily.     lisinopril (PRINIVIL,ZESTRIL) 5 MG tablet Take 5 mg by mouth daily.     Multiple Vitamin (MULTIVITAMIN) tablet Take 1 tablet by mouth daily.     niacin 500 MG tablet Take 500 mg by mouth at bedtime.     Omega-3 Fatty Acids (FISH OIL PO) Take by mouth daily.      Pertinent medications related to GI and procedure were reviewed by me with the patient prior to the procedure   Current Facility-Administered Medications:    0.9 %  sodium chloride  infusion, , Intravenous, Continuous, Quintin Buckle, DO, Last Rate: 20 mL/hr at 03/29/24 1201, Continued from Pre-op at 03/29/24 1201  sodium chloride  20 mL/hr at 03/29/24 1201       No Known Allergies Allergies were reviewed by me prior to the procedure  Objective   Body mass index is 28.35 kg/m. Vitals:   03/29/24 1122  BP: (!) 166/70  Pulse: 61  Resp: 16  Temp: (!) 96.9 F (36.1 C)  TempSrc: Temporal  SpO2: 100%  Weight: 87.1 kg  Height: 5\' 9"  (1.753 m)  Physical Exam Vitals and nursing note reviewed.  Constitutional:      General: He is not in acute distress.    Appearance: Normal appearance. He is not ill-appearing, toxic-appearing or diaphoretic.  HENT:     Head: Normocephalic and atraumatic.     Nose: Nose normal.     Mouth/Throat:     Mouth: Mucous membranes are moist.     Pharynx: Oropharynx is clear.  Eyes:     General: No scleral icterus.    Extraocular Movements: Extraocular movements intact.  Cardiovascular:     Rate and Rhythm: Normal rate and regular rhythm.      Heart sounds: Normal heart sounds. No murmur heard.    No friction rub. No gallop.  Pulmonary:     Effort: Pulmonary effort is normal. No respiratory distress.     Breath sounds: Normal breath sounds. No wheezing, rhonchi or rales.  Abdominal:     General: Bowel sounds are normal. There is no distension.     Palpations: Abdomen is soft.     Tenderness: There is no abdominal tenderness. There is no guarding or rebound.  Musculoskeletal:     Cervical back: Neck supple.     Right lower leg: No edema.     Left lower leg: No edema.  Skin:    General: Skin is warm and dry.     Coloration: Skin is not jaundiced or pale.  Neurological:     General: No focal deficit present.     Mental Status: He is alert and oriented to person, place, and time. Mental status is at baseline.  Psychiatric:        Mood and Affect: Mood normal.        Behavior: Behavior normal.        Thought Content: Thought content normal.        Judgment: Judgment normal.      Assessment:  Johnathan Schneider is a 68 y.o. male  who presents today for Colonoscopy for Personal history of colon polyps .  Plan:  Colonoscopy with possible intervention today  Colonoscopy with possible biopsy, control of bleeding, polypectomy, and interventions as necessary has been discussed with the patient/patient representative. Informed consent was obtained from the patient/patient representative after explaining the indication, nature, and risks of the procedure including but not limited to death, bleeding, perforation, missed neoplasm/lesions, cardiorespiratory compromise, and reaction to medications. Opportunity for questions was given and appropriate answers were provided. Patient/patient representative has verbalized understanding is amenable to undergoing the procedure.   Quintin Buckle, DO  [facility]  Portions of the record may have been created with voice recognition software. Occasional wrong-word  or 'sound-a-like' substitutions may have occurred due to the inherent limitations of voice recognition software.  Read the chart carefully and recognize, using context, where substitutions may have occurred.

## 2024-03-29 NOTE — Transfer of Care (Signed)
 Immediate Anesthesia Transfer of Care Note  Patient: Johnathan Schneider  Procedure(s) Performed: COLONOSCOPY  Patient Location: PACU  Anesthesia Type:General  Level of Consciousness: sedated  Airway & Oxygen Therapy: Patient Spontanous Breathing  Post-op Assessment: Report given to RN and Post -op Vital signs reviewed and stable  Post vital signs: Reviewed and stable  Last Vitals:  Vitals Value Taken Time  BP 114/67 03/29/24 1304  Temp    Pulse 52 03/29/24 1305  Resp 9 03/29/24 1305  SpO2 99 % 03/29/24 1305  Vitals shown include unfiled device data.  Last Pain:  Vitals:   03/29/24 1122  TempSrc: Temporal  PainSc: 0-No pain         Complications: No notable events documented.

## 2024-03-29 NOTE — Anesthesia Preprocedure Evaluation (Addendum)
 Anesthesia Evaluation  Patient identified by MRN, date of birth, ID band Patient awake    Reviewed: Allergy & Precautions, NPO status , Patient's Chart, lab work & pertinent test results  History of Anesthesia Complications Negative for: history of anesthetic complications  Airway Mallampati: IV   Neck ROM: Full    Dental  (+) Missing   Pulmonary neg pulmonary ROS   Pulmonary exam normal breath sounds clear to auscultation       Cardiovascular hypertension, Normal cardiovascular exam Rhythm:Regular Rate:Normal     Neuro/Psych TIA   GI/Hepatic negative GI ROS,,,  Endo/Other  negative endocrine ROS    Renal/GU Renal disease (nephrolithiasis)     Musculoskeletal   Abdominal   Peds  Hematology negative hematology ROS (+)   Anesthesia Other Findings   Reproductive/Obstetrics                             Anesthesia Physical Anesthesia Plan  ASA: 2  Anesthesia Plan: General   Post-op Pain Management:    Induction: Intravenous  PONV Risk Score and Plan: 2 and Propofol  infusion, TIVA and Treatment may vary due to age or medical condition  Airway Management Planned: Natural Airway  Additional Equipment:   Intra-op Plan:   Post-operative Plan:   Informed Consent: I have reviewed the patients History and Physical, chart, labs and discussed the procedure including the risks, benefits and alternatives for the proposed anesthesia with the patient or authorized representative who has indicated his/her understanding and acceptance.       Plan Discussed with: CRNA  Anesthesia Plan Comments: (LMA/GETA backup discussed.  Patient consented for risks of anesthesia including but not limited to:  - adverse reactions to medications - damage to eyes, teeth, lips or other oral mucosa - nerve damage due to positioning  - sore throat or hoarseness - damage to heart, brain, nerves, lungs, other  parts of body or loss of life  Informed patient about role of CRNA in peri- and intra-operative care.  Patient voiced understanding.)       Anesthesia Quick Evaluation

## 2024-03-29 NOTE — Interval H&P Note (Signed)
 History and Physical Interval Note: Preprocedure H&P from 03/29/24  was reviewed and there was no interval change after seeing and examining the patient.  Written consent was obtained from the patient after discussion of risks, benefits, and alternatives. Patient has consented to proceed with Colonoscopy with possible intervention   03/29/2024 12:13 PM  Johnathan Schneider  has presented today for surgery, with the diagnosis of History of colon polyps (Z86.0100).  The various methods of treatment have been discussed with the patient and family. After consideration of risks, benefits and other options for treatment, the patient has consented to  Procedure(s): COLONOSCOPY (N/A) as a surgical intervention.  The patient's history has been reviewed, patient examined, no change in status, stable for surgery.  I have reviewed the patient's chart and labs.  Questions were answered to the patient's satisfaction.     Johnathan Schneider

## 2024-04-01 ENCOUNTER — Encounter: Payer: Self-pay | Admitting: Gastroenterology

## 2024-04-02 LAB — SURGICAL PATHOLOGY
